# Patient Record
Sex: Female | Born: 1965 | Race: White | Hispanic: No | Marital: Married | Smoking: Never smoker
Health system: Southern US, Community
[De-identification: ages and names within clinical notes are randomized; demographics above are authoritative.]

---

## 2009-08-04 ENCOUNTER — Encounter: Admission: RE | Admit: 2009-08-04 | Discharge: 2009-08-04 | Payer: Self-pay | Admitting: Family Medicine

## 2010-01-19 ENCOUNTER — Encounter: Admission: RE | Admit: 2010-01-19 | Discharge: 2010-01-19 | Payer: Self-pay | Admitting: Family Medicine

## 2010-08-05 ENCOUNTER — Encounter
Admission: RE | Admit: 2010-08-05 | Discharge: 2010-08-05 | Payer: Self-pay | Source: Home / Self Care | Attending: Family Medicine | Admitting: Family Medicine

## 2011-07-11 ENCOUNTER — Other Ambulatory Visit: Payer: Self-pay | Admitting: Obstetrics and Gynecology

## 2011-07-11 DIAGNOSIS — Z1231 Encounter for screening mammogram for malignant neoplasm of breast: Secondary | ICD-10-CM

## 2011-08-09 ENCOUNTER — Ambulatory Visit
Admission: RE | Admit: 2011-08-09 | Discharge: 2011-08-09 | Disposition: A | Discharge status: Home / Self Care | Payer: Self-pay | Source: Ambulatory Visit | Attending: Obstetrics and Gynecology | Admitting: Obstetrics and Gynecology

## 2011-08-09 DIAGNOSIS — Z1231 Encounter for screening mammogram for malignant neoplasm of breast: Secondary | ICD-10-CM

## 2012-07-09 ENCOUNTER — Other Ambulatory Visit: Payer: Self-pay | Admitting: Obstetrics and Gynecology

## 2012-07-09 DIAGNOSIS — Z1231 Encounter for screening mammogram for malignant neoplasm of breast: Secondary | ICD-10-CM

## 2012-08-14 ENCOUNTER — Ambulatory Visit
Admission: RE | Admit: 2012-08-14 | Discharge: 2012-08-14 | Disposition: A | Discharge status: Home / Self Care | Payer: BC Managed Care – PPO | Source: Ambulatory Visit | Attending: Obstetrics and Gynecology | Admitting: Obstetrics and Gynecology

## 2012-08-14 DIAGNOSIS — Z1231 Encounter for screening mammogram for malignant neoplasm of breast: Secondary | ICD-10-CM

## 2013-08-05 ENCOUNTER — Other Ambulatory Visit: Payer: Self-pay

## 2013-08-05 DIAGNOSIS — Z1231 Encounter for screening mammogram for malignant neoplasm of breast: Secondary | ICD-10-CM

## 2013-08-27 ENCOUNTER — Ambulatory Visit
Admission: RE | Admit: 2013-08-27 | Discharge: 2013-08-27 | Disposition: A | Discharge status: Home / Self Care | Payer: BC Managed Care – PPO | Source: Ambulatory Visit

## 2013-08-27 DIAGNOSIS — Z1231 Encounter for screening mammogram for malignant neoplasm of breast: Secondary | ICD-10-CM

## 2014-08-11 ENCOUNTER — Other Ambulatory Visit: Payer: Self-pay

## 2014-08-11 DIAGNOSIS — Z1231 Encounter for screening mammogram for malignant neoplasm of breast: Secondary | ICD-10-CM

## 2014-08-28 ENCOUNTER — Ambulatory Visit
Admission: RE | Admit: 2014-08-28 | Discharge: 2014-08-28 | Disposition: A | Discharge status: Home / Self Care | Payer: BLUE CROSS/BLUE SHIELD | Source: Ambulatory Visit

## 2014-08-28 DIAGNOSIS — Z1231 Encounter for screening mammogram for malignant neoplasm of breast: Secondary | ICD-10-CM

## 2015-08-05 ENCOUNTER — Other Ambulatory Visit: Payer: Self-pay

## 2015-08-12 ENCOUNTER — Other Ambulatory Visit: Payer: Self-pay | Admitting: Obstetrics and Gynecology

## 2015-08-12 DIAGNOSIS — N644 Mastodynia: Secondary | ICD-10-CM

## 2015-08-19 ENCOUNTER — Ambulatory Visit
Admission: RE | Admit: 2015-08-19 | Discharge: 2015-08-19 | Disposition: A | Discharge status: Home / Self Care | Payer: BLUE CROSS/BLUE SHIELD | Source: Ambulatory Visit | Attending: Obstetrics and Gynecology | Admitting: Obstetrics and Gynecology

## 2015-08-19 ENCOUNTER — Other Ambulatory Visit: Payer: Self-pay | Admitting: Obstetrics and Gynecology

## 2015-08-19 DIAGNOSIS — N6320 Unspecified lump in the left breast, unspecified quadrant: Secondary | ICD-10-CM

## 2015-08-19 DIAGNOSIS — N644 Mastodynia: Secondary | ICD-10-CM

## 2015-12-02 DIAGNOSIS — L84 Corns and callosities: Secondary | ICD-10-CM | POA: Diagnosis not present

## 2015-12-02 DIAGNOSIS — Z86018 Personal history of other benign neoplasm: Secondary | ICD-10-CM | POA: Diagnosis not present

## 2015-12-02 DIAGNOSIS — Z808 Family history of malignant neoplasm of other organs or systems: Secondary | ICD-10-CM | POA: Diagnosis not present

## 2015-12-02 DIAGNOSIS — D225 Melanocytic nevi of trunk: Secondary | ICD-10-CM | POA: Diagnosis not present

## 2016-05-11 DIAGNOSIS — Z23 Encounter for immunization: Secondary | ICD-10-CM | POA: Diagnosis not present

## 2016-05-11 DIAGNOSIS — H01009 Unspecified blepharitis unspecified eye, unspecified eyelid: Secondary | ICD-10-CM | POA: Diagnosis not present

## 2016-05-11 DIAGNOSIS — E039 Hypothyroidism, unspecified: Secondary | ICD-10-CM | POA: Diagnosis not present

## 2016-05-11 DIAGNOSIS — H00016 Hordeolum externum left eye, unspecified eyelid: Secondary | ICD-10-CM | POA: Diagnosis not present

## 2016-06-07 DIAGNOSIS — Z6826 Body mass index (BMI) 26.0-26.9, adult: Secondary | ICD-10-CM | POA: Diagnosis not present

## 2016-06-07 DIAGNOSIS — Z01419 Encounter for gynecological examination (general) (routine) without abnormal findings: Secondary | ICD-10-CM | POA: Diagnosis not present

## 2016-07-05 DIAGNOSIS — Z808 Family history of malignant neoplasm of other organs or systems: Secondary | ICD-10-CM | POA: Diagnosis not present

## 2016-07-05 DIAGNOSIS — D225 Melanocytic nevi of trunk: Secondary | ICD-10-CM | POA: Diagnosis not present

## 2016-07-05 DIAGNOSIS — Z23 Encounter for immunization: Secondary | ICD-10-CM | POA: Diagnosis not present

## 2016-08-29 ENCOUNTER — Other Ambulatory Visit: Payer: Self-pay | Admitting: Obstetrics and Gynecology

## 2016-08-29 DIAGNOSIS — Z1231 Encounter for screening mammogram for malignant neoplasm of breast: Secondary | ICD-10-CM

## 2016-09-07 DIAGNOSIS — N924 Excessive bleeding in the premenopausal period: Secondary | ICD-10-CM | POA: Diagnosis not present

## 2016-09-07 DIAGNOSIS — N921 Excessive and frequent menstruation with irregular cycle: Secondary | ICD-10-CM | POA: Diagnosis not present

## 2016-09-22 DIAGNOSIS — Z7189 Other specified counseling: Secondary | ICD-10-CM | POA: Diagnosis not present

## 2016-09-22 DIAGNOSIS — F329 Major depressive disorder, single episode, unspecified: Secondary | ICD-10-CM | POA: Diagnosis not present

## 2016-09-22 DIAGNOSIS — F419 Anxiety disorder, unspecified: Secondary | ICD-10-CM | POA: Diagnosis not present

## 2016-09-22 DIAGNOSIS — E039 Hypothyroidism, unspecified: Secondary | ICD-10-CM | POA: Diagnosis not present

## 2016-09-26 ENCOUNTER — Ambulatory Visit
Admission: RE | Admit: 2016-09-26 | Discharge: 2016-09-26 | Disposition: A | Discharge status: Home / Self Care | Payer: BLUE CROSS/BLUE SHIELD | Source: Ambulatory Visit | Attending: Obstetrics and Gynecology | Admitting: Obstetrics and Gynecology

## 2016-09-26 DIAGNOSIS — Z1231 Encounter for screening mammogram for malignant neoplasm of breast: Secondary | ICD-10-CM

## 2016-11-22 DIAGNOSIS — N926 Irregular menstruation, unspecified: Secondary | ICD-10-CM | POA: Diagnosis not present

## 2016-11-22 DIAGNOSIS — E039 Hypothyroidism, unspecified: Secondary | ICD-10-CM | POA: Diagnosis not present

## 2016-11-22 DIAGNOSIS — R1032 Left lower quadrant pain: Secondary | ICD-10-CM | POA: Diagnosis not present

## 2017-02-06 DIAGNOSIS — D225 Melanocytic nevi of trunk: Secondary | ICD-10-CM | POA: Diagnosis not present

## 2017-02-06 DIAGNOSIS — L249 Irritant contact dermatitis, unspecified cause: Secondary | ICD-10-CM | POA: Diagnosis not present

## 2017-02-06 DIAGNOSIS — Z808 Family history of malignant neoplasm of other organs or systems: Secondary | ICD-10-CM | POA: Diagnosis not present

## 2017-02-06 DIAGNOSIS — Z86018 Personal history of other benign neoplasm: Secondary | ICD-10-CM | POA: Diagnosis not present

## 2017-06-12 DIAGNOSIS — Z23 Encounter for immunization: Secondary | ICD-10-CM | POA: Diagnosis not present

## 2017-06-19 DIAGNOSIS — Z6825 Body mass index (BMI) 25.0-25.9, adult: Secondary | ICD-10-CM | POA: Diagnosis not present

## 2017-06-19 DIAGNOSIS — Z3200 Encounter for pregnancy test, result unknown: Secondary | ICD-10-CM | POA: Diagnosis not present

## 2017-06-19 DIAGNOSIS — Z01419 Encounter for gynecological examination (general) (routine) without abnormal findings: Secondary | ICD-10-CM | POA: Diagnosis not present

## 2017-06-19 DIAGNOSIS — N952 Postmenopausal atrophic vaginitis: Secondary | ICD-10-CM | POA: Diagnosis not present

## 2017-08-09 DIAGNOSIS — Z86018 Personal history of other benign neoplasm: Secondary | ICD-10-CM | POA: Diagnosis not present

## 2017-08-09 DIAGNOSIS — Z808 Family history of malignant neoplasm of other organs or systems: Secondary | ICD-10-CM | POA: Diagnosis not present

## 2017-08-09 DIAGNOSIS — D225 Melanocytic nevi of trunk: Secondary | ICD-10-CM | POA: Diagnosis not present

## 2017-08-09 DIAGNOSIS — L570 Actinic keratosis: Secondary | ICD-10-CM | POA: Diagnosis not present

## 2017-08-17 ENCOUNTER — Other Ambulatory Visit: Payer: Self-pay | Admitting: Obstetrics and Gynecology

## 2017-08-17 DIAGNOSIS — Z1231 Encounter for screening mammogram for malignant neoplasm of breast: Secondary | ICD-10-CM

## 2017-08-23 DIAGNOSIS — S86111D Strain of other muscle(s) and tendon(s) of posterior muscle group at lower leg level, right leg, subsequent encounter: Secondary | ICD-10-CM | POA: Diagnosis not present

## 2017-08-23 DIAGNOSIS — S86811A Strain of other muscle(s) and tendon(s) at lower leg level, right leg, initial encounter: Secondary | ICD-10-CM | POA: Diagnosis not present

## 2017-08-23 DIAGNOSIS — M79661 Pain in right lower leg: Secondary | ICD-10-CM | POA: Diagnosis not present

## 2017-08-27 DIAGNOSIS — F419 Anxiety disorder, unspecified: Secondary | ICD-10-CM | POA: Diagnosis not present

## 2017-08-27 DIAGNOSIS — F322 Major depressive disorder, single episode, severe without psychotic features: Secondary | ICD-10-CM | POA: Diagnosis not present

## 2017-08-27 DIAGNOSIS — Z131 Encounter for screening for diabetes mellitus: Secondary | ICD-10-CM | POA: Diagnosis not present

## 2017-08-27 DIAGNOSIS — E039 Hypothyroidism, unspecified: Secondary | ICD-10-CM | POA: Diagnosis not present

## 2017-08-27 DIAGNOSIS — E559 Vitamin D deficiency, unspecified: Secondary | ICD-10-CM | POA: Diagnosis not present

## 2017-08-29 DIAGNOSIS — M79661 Pain in right lower leg: Secondary | ICD-10-CM | POA: Diagnosis not present

## 2017-08-29 DIAGNOSIS — S86111D Strain of other muscle(s) and tendon(s) of posterior muscle group at lower leg level, right leg, subsequent encounter: Secondary | ICD-10-CM | POA: Diagnosis not present

## 2017-08-31 DIAGNOSIS — S86111D Strain of other muscle(s) and tendon(s) of posterior muscle group at lower leg level, right leg, subsequent encounter: Secondary | ICD-10-CM | POA: Diagnosis not present

## 2017-08-31 DIAGNOSIS — M79661 Pain in right lower leg: Secondary | ICD-10-CM | POA: Diagnosis not present

## 2017-09-03 DIAGNOSIS — S86111D Strain of other muscle(s) and tendon(s) of posterior muscle group at lower leg level, right leg, subsequent encounter: Secondary | ICD-10-CM | POA: Diagnosis not present

## 2017-09-03 DIAGNOSIS — M79661 Pain in right lower leg: Secondary | ICD-10-CM | POA: Diagnosis not present

## 2017-09-04 DIAGNOSIS — M79661 Pain in right lower leg: Secondary | ICD-10-CM | POA: Diagnosis not present

## 2017-09-04 DIAGNOSIS — M2012 Hallux valgus (acquired), left foot: Secondary | ICD-10-CM | POA: Diagnosis not present

## 2017-09-04 DIAGNOSIS — S86111D Strain of other muscle(s) and tendon(s) of posterior muscle group at lower leg level, right leg, subsequent encounter: Secondary | ICD-10-CM | POA: Diagnosis not present

## 2017-09-04 DIAGNOSIS — M2011 Hallux valgus (acquired), right foot: Secondary | ICD-10-CM | POA: Diagnosis not present

## 2017-09-11 DIAGNOSIS — S86111D Strain of other muscle(s) and tendon(s) of posterior muscle group at lower leg level, right leg, subsequent encounter: Secondary | ICD-10-CM | POA: Diagnosis not present

## 2017-09-11 DIAGNOSIS — M79661 Pain in right lower leg: Secondary | ICD-10-CM | POA: Diagnosis not present

## 2017-09-11 DIAGNOSIS — D485 Neoplasm of uncertain behavior of skin: Secondary | ICD-10-CM | POA: Diagnosis not present

## 2017-09-11 DIAGNOSIS — L570 Actinic keratosis: Secondary | ICD-10-CM | POA: Diagnosis not present

## 2017-09-13 DIAGNOSIS — M79661 Pain in right lower leg: Secondary | ICD-10-CM | POA: Diagnosis not present

## 2017-09-13 DIAGNOSIS — S86111D Strain of other muscle(s) and tendon(s) of posterior muscle group at lower leg level, right leg, subsequent encounter: Secondary | ICD-10-CM | POA: Diagnosis not present

## 2017-09-18 DIAGNOSIS — M79661 Pain in right lower leg: Secondary | ICD-10-CM | POA: Diagnosis not present

## 2017-09-18 DIAGNOSIS — S86111D Strain of other muscle(s) and tendon(s) of posterior muscle group at lower leg level, right leg, subsequent encounter: Secondary | ICD-10-CM | POA: Diagnosis not present

## 2017-09-19 DIAGNOSIS — Z3202 Encounter for pregnancy test, result negative: Secondary | ICD-10-CM | POA: Diagnosis not present

## 2017-09-19 DIAGNOSIS — N952 Postmenopausal atrophic vaginitis: Secondary | ICD-10-CM | POA: Diagnosis not present

## 2017-09-19 DIAGNOSIS — N941 Unspecified dyspareunia: Secondary | ICD-10-CM | POA: Diagnosis not present

## 2017-09-20 DIAGNOSIS — M79661 Pain in right lower leg: Secondary | ICD-10-CM | POA: Diagnosis not present

## 2017-09-20 DIAGNOSIS — S86111D Strain of other muscle(s) and tendon(s) of posterior muscle group at lower leg level, right leg, subsequent encounter: Secondary | ICD-10-CM | POA: Diagnosis not present

## 2017-09-28 ENCOUNTER — Ambulatory Visit
Admission: RE | Admit: 2017-09-28 | Discharge: 2017-09-28 | Disposition: A | Discharge status: Home / Self Care | Payer: BLUE CROSS/BLUE SHIELD | Source: Ambulatory Visit | Attending: Obstetrics and Gynecology | Admitting: Obstetrics and Gynecology

## 2017-09-28 DIAGNOSIS — Z1231 Encounter for screening mammogram for malignant neoplasm of breast: Secondary | ICD-10-CM | POA: Diagnosis not present

## 2017-09-28 DIAGNOSIS — M79661 Pain in right lower leg: Secondary | ICD-10-CM | POA: Diagnosis not present

## 2017-09-28 DIAGNOSIS — S86111D Strain of other muscle(s) and tendon(s) of posterior muscle group at lower leg level, right leg, subsequent encounter: Secondary | ICD-10-CM | POA: Diagnosis not present

## 2017-10-04 DIAGNOSIS — S86111D Strain of other muscle(s) and tendon(s) of posterior muscle group at lower leg level, right leg, subsequent encounter: Secondary | ICD-10-CM | POA: Diagnosis not present

## 2017-10-04 DIAGNOSIS — M79661 Pain in right lower leg: Secondary | ICD-10-CM | POA: Diagnosis not present

## 2017-10-10 DIAGNOSIS — S86111D Strain of other muscle(s) and tendon(s) of posterior muscle group at lower leg level, right leg, subsequent encounter: Secondary | ICD-10-CM | POA: Diagnosis not present

## 2017-10-10 DIAGNOSIS — M79661 Pain in right lower leg: Secondary | ICD-10-CM | POA: Diagnosis not present

## 2017-10-12 DIAGNOSIS — S86111D Strain of other muscle(s) and tendon(s) of posterior muscle group at lower leg level, right leg, subsequent encounter: Secondary | ICD-10-CM | POA: Diagnosis not present

## 2017-10-12 DIAGNOSIS — M79661 Pain in right lower leg: Secondary | ICD-10-CM | POA: Diagnosis not present

## 2017-10-15 DIAGNOSIS — M79661 Pain in right lower leg: Secondary | ICD-10-CM | POA: Diagnosis not present

## 2017-11-15 DIAGNOSIS — M79661 Pain in right lower leg: Secondary | ICD-10-CM | POA: Diagnosis not present

## 2017-11-21 DIAGNOSIS — M79661 Pain in right lower leg: Secondary | ICD-10-CM | POA: Diagnosis not present

## 2017-11-26 DIAGNOSIS — M79661 Pain in right lower leg: Secondary | ICD-10-CM | POA: Diagnosis not present

## 2017-12-25 DIAGNOSIS — M79661 Pain in right lower leg: Secondary | ICD-10-CM | POA: Diagnosis not present

## 2018-02-06 DIAGNOSIS — D225 Melanocytic nevi of trunk: Secondary | ICD-10-CM | POA: Diagnosis not present

## 2018-02-06 DIAGNOSIS — Z86018 Personal history of other benign neoplasm: Secondary | ICD-10-CM | POA: Diagnosis not present

## 2018-02-06 DIAGNOSIS — Z808 Family history of malignant neoplasm of other organs or systems: Secondary | ICD-10-CM | POA: Diagnosis not present

## 2018-04-25 DIAGNOSIS — J019 Acute sinusitis, unspecified: Secondary | ICD-10-CM | POA: Diagnosis not present

## 2018-05-31 DIAGNOSIS — F331 Major depressive disorder, recurrent, moderate: Secondary | ICD-10-CM | POA: Diagnosis not present

## 2018-06-24 DIAGNOSIS — Z01419 Encounter for gynecological examination (general) (routine) without abnormal findings: Secondary | ICD-10-CM | POA: Diagnosis not present

## 2018-06-24 DIAGNOSIS — Z6825 Body mass index (BMI) 25.0-25.9, adult: Secondary | ICD-10-CM | POA: Diagnosis not present

## 2018-08-21 DIAGNOSIS — Z23 Encounter for immunization: Secondary | ICD-10-CM | POA: Diagnosis not present

## 2018-08-21 DIAGNOSIS — D2262 Melanocytic nevi of left upper limb, including shoulder: Secondary | ICD-10-CM | POA: Diagnosis not present

## 2018-08-21 DIAGNOSIS — D225 Melanocytic nevi of trunk: Secondary | ICD-10-CM | POA: Diagnosis not present

## 2018-08-21 DIAGNOSIS — L821 Other seborrheic keratosis: Secondary | ICD-10-CM | POA: Diagnosis not present

## 2018-08-21 DIAGNOSIS — L570 Actinic keratosis: Secondary | ICD-10-CM | POA: Diagnosis not present

## 2018-08-24 DIAGNOSIS — M67912 Unspecified disorder of synovium and tendon, left shoulder: Secondary | ICD-10-CM | POA: Diagnosis not present

## 2018-08-28 DIAGNOSIS — E559 Vitamin D deficiency, unspecified: Secondary | ICD-10-CM | POA: Diagnosis not present

## 2018-08-28 DIAGNOSIS — F322 Major depressive disorder, single episode, severe without psychotic features: Secondary | ICD-10-CM | POA: Diagnosis not present

## 2018-08-28 DIAGNOSIS — E039 Hypothyroidism, unspecified: Secondary | ICD-10-CM | POA: Diagnosis not present

## 2018-09-24 ENCOUNTER — Other Ambulatory Visit: Payer: Self-pay | Admitting: Obstetrics and Gynecology

## 2018-09-24 DIAGNOSIS — Z1231 Encounter for screening mammogram for malignant neoplasm of breast: Secondary | ICD-10-CM

## 2018-10-10 ENCOUNTER — Ambulatory Visit
Admission: RE | Admit: 2018-10-10 | Discharge: 2018-10-10 | Disposition: A | Discharge status: Home / Self Care | Payer: BLUE CROSS/BLUE SHIELD | Source: Ambulatory Visit | Attending: Obstetrics and Gynecology | Admitting: Obstetrics and Gynecology

## 2018-10-10 ENCOUNTER — Other Ambulatory Visit: Payer: Self-pay

## 2018-10-10 DIAGNOSIS — Z1231 Encounter for screening mammogram for malignant neoplasm of breast: Secondary | ICD-10-CM | POA: Diagnosis not present

## 2018-10-14 DIAGNOSIS — M67912 Unspecified disorder of synovium and tendon, left shoulder: Secondary | ICD-10-CM | POA: Diagnosis not present

## 2018-10-14 DIAGNOSIS — M79672 Pain in left foot: Secondary | ICD-10-CM | POA: Diagnosis not present

## 2018-10-18 ENCOUNTER — Ambulatory Visit: Payer: BLUE CROSS/BLUE SHIELD

## 2019-01-24 DIAGNOSIS — F419 Anxiety disorder, unspecified: Secondary | ICD-10-CM | POA: Diagnosis not present

## 2019-01-24 DIAGNOSIS — F329 Major depressive disorder, single episode, unspecified: Secondary | ICD-10-CM | POA: Diagnosis not present

## 2019-01-27 ENCOUNTER — Other Ambulatory Visit: Payer: Self-pay

## 2019-01-27 ENCOUNTER — Other Ambulatory Visit: Payer: Self-pay | Admitting: Podiatry

## 2019-01-27 ENCOUNTER — Encounter: Payer: Self-pay | Admitting: Podiatry

## 2019-01-27 ENCOUNTER — Ambulatory Visit (INDEPENDENT_AMBULATORY_CARE_PROVIDER_SITE_OTHER): Payer: BLUE CROSS/BLUE SHIELD | Admitting: Podiatry

## 2019-01-27 ENCOUNTER — Ambulatory Visit (INDEPENDENT_AMBULATORY_CARE_PROVIDER_SITE_OTHER): Payer: BLUE CROSS/BLUE SHIELD

## 2019-01-27 VITALS — Temp 97.9°F

## 2019-01-27 DIAGNOSIS — M779 Enthesopathy, unspecified: Secondary | ICD-10-CM

## 2019-01-27 DIAGNOSIS — M79672 Pain in left foot: Secondary | ICD-10-CM

## 2019-01-27 DIAGNOSIS — M722 Plantar fascial fibromatosis: Secondary | ICD-10-CM

## 2019-01-27 DIAGNOSIS — M79671 Pain in right foot: Secondary | ICD-10-CM | POA: Diagnosis not present

## 2019-01-27 NOTE — Progress Notes (Signed)
Subjective:   Patient ID: Sherri Bell, female   DOB: 54 y.o.   MRN: [id number]   HPI Patient presents with moderate chronic plantar midfoot pain bilateral stating that she has had orthotics for approximately 18 years which have helped her but they are starting to lose their ability and it is worse when she is barefoot or when she wears shoes without support.  States it is gradually become more of an issue for her and patient does not smoke and likes to be active   Review of Systems  All other systems reviewed and are negative.       Objective:  Physical Exam Vitals signs and nursing note reviewed.  Constitutional:      Appearance: She is well-developed.  Pulmonary:     Effort: Pulmonary effort is normal.  Musculoskeletal: Normal range of motion.  Skin:    General: Skin is warm.  Neurological:     Mental Status: She is alert.     Neurovascular status found to be intact muscle strength is adequate range of motion within normal limits with patient found to have midfoot pain bilateral that also is secondary to her high arch foot structure with a thin heel pad and also bunion deformity right over left.  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  At this point we discussed things for her to do it at this point I recommended orthotics and she saw the ped orthotist who casted for functional orthotics and will have these made for her at the current time.  Patient will be seen back when ready and we discussed continued exercises and possibility for physical therapy and at this point I am referring to physical therapist for strengthening and stretching exercises due to the longstanding nature of chronic pain  X-rays indicate there is significant structural bunion  deformity right over left with cavus foot structure bilateral

## 2019-01-28 NOTE — Addendum Note (Signed)
Addended by: Celene Skeen A on: 01/28/2019 12:34 PM   Modules accepted: Orders

## 2019-02-07 DIAGNOSIS — M79661 Pain in right lower leg: Secondary | ICD-10-CM | POA: Diagnosis not present

## 2019-02-07 DIAGNOSIS — M79672 Pain in left foot: Secondary | ICD-10-CM | POA: Diagnosis not present

## 2019-02-07 DIAGNOSIS — M79671 Pain in right foot: Secondary | ICD-10-CM | POA: Diagnosis not present

## 2019-02-13 DIAGNOSIS — Z808 Family history of malignant neoplasm of other organs or systems: Secondary | ICD-10-CM | POA: Diagnosis not present

## 2019-02-13 DIAGNOSIS — D2272 Melanocytic nevi of left lower limb, including hip: Secondary | ICD-10-CM | POA: Diagnosis not present

## 2019-02-13 DIAGNOSIS — Z86018 Personal history of other benign neoplasm: Secondary | ICD-10-CM | POA: Diagnosis not present

## 2019-02-13 DIAGNOSIS — D225 Melanocytic nevi of trunk: Secondary | ICD-10-CM | POA: Diagnosis not present

## 2019-02-14 DIAGNOSIS — M79671 Pain in right foot: Secondary | ICD-10-CM | POA: Diagnosis not present

## 2019-02-14 DIAGNOSIS — M79672 Pain in left foot: Secondary | ICD-10-CM | POA: Diagnosis not present

## 2019-02-14 DIAGNOSIS — M79661 Pain in right lower leg: Secondary | ICD-10-CM | POA: Diagnosis not present

## 2019-02-18 ENCOUNTER — Other Ambulatory Visit: Payer: Self-pay

## 2019-02-18 ENCOUNTER — Ambulatory Visit: Payer: BLUE CROSS/BLUE SHIELD | Admitting: Orthotics

## 2019-02-18 DIAGNOSIS — M722 Plantar fascial fibromatosis: Secondary | ICD-10-CM

## 2019-02-18 DIAGNOSIS — M79671 Pain in right foot: Secondary | ICD-10-CM

## 2019-02-18 DIAGNOSIS — M79672 Pain in left foot: Secondary | ICD-10-CM

## 2019-02-18 NOTE — Progress Notes (Signed)
Patient came in today to pick up custom made foot orthotics.  The goals were accomplished and the patient reported no dissatisfaction with said orthotics.  Patient was advised of breakin period and how to report any issues. 

## 2019-02-21 DIAGNOSIS — M79661 Pain in right lower leg: Secondary | ICD-10-CM | POA: Diagnosis not present

## 2019-02-21 DIAGNOSIS — M79671 Pain in right foot: Secondary | ICD-10-CM | POA: Diagnosis not present

## 2019-02-21 DIAGNOSIS — M79672 Pain in left foot: Secondary | ICD-10-CM | POA: Diagnosis not present

## 2019-03-27 DIAGNOSIS — F329 Major depressive disorder, single episode, unspecified: Secondary | ICD-10-CM | POA: Diagnosis not present

## 2019-03-27 DIAGNOSIS — M549 Dorsalgia, unspecified: Secondary | ICD-10-CM | POA: Diagnosis not present

## 2019-03-27 DIAGNOSIS — F419 Anxiety disorder, unspecified: Secondary | ICD-10-CM | POA: Diagnosis not present

## 2019-03-27 DIAGNOSIS — Z23 Encounter for immunization: Secondary | ICD-10-CM | POA: Diagnosis not present

## 2019-03-31 ENCOUNTER — Other Ambulatory Visit: Payer: Self-pay

## 2019-03-31 ENCOUNTER — Ambulatory Visit: Payer: BLUE CROSS/BLUE SHIELD | Admitting: Orthotics

## 2019-03-31 DIAGNOSIS — M79672 Pain in left foot: Secondary | ICD-10-CM

## 2019-03-31 DIAGNOSIS — M79671 Pain in right foot: Secondary | ICD-10-CM

## 2019-03-31 DIAGNOSIS — M722 Plantar fascial fibromatosis: Secondary | ICD-10-CM

## 2019-04-03 DIAGNOSIS — H00013 Hordeolum externum right eye, unspecified eyelid: Secondary | ICD-10-CM | POA: Diagnosis not present

## 2019-04-08 NOTE — Progress Notes (Signed)
Adjusted f/o to patients satisfaction.

## 2019-04-17 DIAGNOSIS — R6882 Decreased libido: Secondary | ICD-10-CM | POA: Diagnosis not present

## 2019-04-17 DIAGNOSIS — R102 Pelvic and perineal pain: Secondary | ICD-10-CM | POA: Diagnosis not present

## 2019-04-17 DIAGNOSIS — N912 Amenorrhea, unspecified: Secondary | ICD-10-CM | POA: Diagnosis not present

## 2019-05-14 DIAGNOSIS — Z23 Encounter for immunization: Secondary | ICD-10-CM | POA: Diagnosis not present

## 2019-06-02 DIAGNOSIS — M549 Dorsalgia, unspecified: Secondary | ICD-10-CM | POA: Diagnosis not present

## 2019-06-30 DIAGNOSIS — N950 Postmenopausal bleeding: Secondary | ICD-10-CM | POA: Diagnosis not present

## 2019-06-30 DIAGNOSIS — Z6826 Body mass index (BMI) 26.0-26.9, adult: Secondary | ICD-10-CM | POA: Diagnosis not present

## 2019-06-30 DIAGNOSIS — Z01419 Encounter for gynecological examination (general) (routine) without abnormal findings: Secondary | ICD-10-CM | POA: Diagnosis not present

## 2019-06-30 DIAGNOSIS — N952 Postmenopausal atrophic vaginitis: Secondary | ICD-10-CM | POA: Diagnosis not present

## 2019-07-03 ENCOUNTER — Other Ambulatory Visit: Payer: Self-pay | Admitting: Physician Assistant

## 2019-07-03 DIAGNOSIS — M549 Dorsalgia, unspecified: Secondary | ICD-10-CM

## 2019-07-16 ENCOUNTER — Ambulatory Visit
Admission: RE | Admit: 2019-07-16 | Discharge: 2019-07-16 | Disposition: A | Discharge status: Home / Self Care | Payer: BLUE CROSS/BLUE SHIELD | Source: Ambulatory Visit | Attending: Physician Assistant | Admitting: Physician Assistant

## 2019-07-16 ENCOUNTER — Other Ambulatory Visit: Payer: Self-pay

## 2019-07-16 DIAGNOSIS — M549 Dorsalgia, unspecified: Secondary | ICD-10-CM

## 2019-07-16 DIAGNOSIS — M5124 Other intervertebral disc displacement, thoracic region: Secondary | ICD-10-CM | POA: Diagnosis not present

## 2019-08-27 DIAGNOSIS — L821 Other seborrheic keratosis: Secondary | ICD-10-CM | POA: Diagnosis not present

## 2019-08-27 DIAGNOSIS — D2262 Melanocytic nevi of left upper limb, including shoulder: Secondary | ICD-10-CM | POA: Diagnosis not present

## 2019-08-27 DIAGNOSIS — L570 Actinic keratosis: Secondary | ICD-10-CM | POA: Diagnosis not present

## 2019-08-29 DIAGNOSIS — Z131 Encounter for screening for diabetes mellitus: Secondary | ICD-10-CM | POA: Diagnosis not present

## 2019-08-29 DIAGNOSIS — Z13220 Encounter for screening for lipoid disorders: Secondary | ICD-10-CM | POA: Diagnosis not present

## 2019-08-29 DIAGNOSIS — E039 Hypothyroidism, unspecified: Secondary | ICD-10-CM | POA: Diagnosis not present

## 2019-08-29 DIAGNOSIS — G43909 Migraine, unspecified, not intractable, without status migrainosus: Secondary | ICD-10-CM | POA: Diagnosis not present

## 2019-08-29 DIAGNOSIS — F322 Major depressive disorder, single episode, severe without psychotic features: Secondary | ICD-10-CM | POA: Diagnosis not present

## 2019-09-01 DIAGNOSIS — Z23 Encounter for immunization: Secondary | ICD-10-CM | POA: Diagnosis not present

## 2019-09-19 ENCOUNTER — Other Ambulatory Visit: Payer: Self-pay | Admitting: Obstetrics and Gynecology

## 2019-09-19 DIAGNOSIS — Z1231 Encounter for screening mammogram for malignant neoplasm of breast: Secondary | ICD-10-CM

## 2019-10-23 ENCOUNTER — Ambulatory Visit: Payer: BLUE CROSS/BLUE SHIELD

## 2019-11-27 DIAGNOSIS — K529 Noninfective gastroenteritis and colitis, unspecified: Secondary | ICD-10-CM | POA: Diagnosis not present

## 2019-12-25 ENCOUNTER — Other Ambulatory Visit: Payer: Self-pay

## 2019-12-25 ENCOUNTER — Ambulatory Visit
Admission: RE | Admit: 2019-12-25 | Discharge: 2019-12-25 | Disposition: A | Discharge status: Home / Self Care | Payer: BLUE CROSS/BLUE SHIELD | Source: Ambulatory Visit | Attending: Obstetrics and Gynecology | Admitting: Obstetrics and Gynecology

## 2019-12-25 DIAGNOSIS — Z1231 Encounter for screening mammogram for malignant neoplasm of breast: Secondary | ICD-10-CM | POA: Diagnosis not present

## 2020-01-15 DIAGNOSIS — N760 Acute vaginitis: Secondary | ICD-10-CM | POA: Diagnosis not present

## 2020-01-15 DIAGNOSIS — N950 Postmenopausal bleeding: Secondary | ICD-10-CM | POA: Diagnosis not present

## 2020-01-15 DIAGNOSIS — R102 Pelvic and perineal pain: Secondary | ICD-10-CM | POA: Diagnosis not present

## 2020-01-21 DIAGNOSIS — R7989 Other specified abnormal findings of blood chemistry: Secondary | ICD-10-CM | POA: Diagnosis not present

## 2020-01-21 DIAGNOSIS — R102 Pelvic and perineal pain: Secondary | ICD-10-CM | POA: Diagnosis not present

## 2020-01-21 DIAGNOSIS — Z309 Encounter for contraceptive management, unspecified: Secondary | ICD-10-CM | POA: Diagnosis not present

## 2020-08-05 ENCOUNTER — Ambulatory Visit: Payer: Managed Care, Other (non HMO) | Admitting: Podiatry

## 2020-08-05 ENCOUNTER — Ambulatory Visit (INDEPENDENT_AMBULATORY_CARE_PROVIDER_SITE_OTHER): Payer: Managed Care, Other (non HMO)

## 2020-08-05 ENCOUNTER — Other Ambulatory Visit: Payer: Self-pay

## 2020-08-05 ENCOUNTER — Encounter: Payer: Self-pay | Admitting: Podiatry

## 2020-08-05 DIAGNOSIS — M79671 Pain in right foot: Secondary | ICD-10-CM | POA: Diagnosis not present

## 2020-08-05 DIAGNOSIS — M25571 Pain in right ankle and joints of right foot: Secondary | ICD-10-CM

## 2020-08-05 DIAGNOSIS — M79672 Pain in left foot: Secondary | ICD-10-CM

## 2020-08-05 DIAGNOSIS — M7671 Peroneal tendinitis, right leg: Secondary | ICD-10-CM | POA: Diagnosis not present

## 2020-08-05 DIAGNOSIS — M722 Plantar fascial fibromatosis: Secondary | ICD-10-CM | POA: Diagnosis not present

## 2020-08-05 DIAGNOSIS — Q828 Other specified congenital malformations of skin: Secondary | ICD-10-CM

## 2020-08-05 MED ORDER — TRIAMCINOLONE ACETONIDE 10 MG/ML IJ SUSP
10.0000 mg | Freq: Once | INTRAMUSCULAR | Status: AC
  Administered 2020-08-05: 10 mg

## 2020-08-06 NOTE — Progress Notes (Signed)
Subjective:   Patient ID: Sherri Bell, female   DOB: 55 y.o.   MRN: [id number]   HPI Patient presents stating she has a lot of pain in the outside of her right foot and also her heel while improved still is present some and she does not know of any specific injury   ROS      Objective:  Physical Exam  Neurovascular status intact with pain of the outside of the right foot which is acute in nature with chronic discomfort of the plantar fascia bilateral with patient who does have history of plantar fasciitis has orthotics and needs modification of them     Assessment:  Neurovascular status intact with patient found to have inflammation pain of the outside of the right foot at the peroneal insertion base of fifth metatarsal with no indication of tendon disruption.  There is moderate discomfort in the plantar fascial bilateral     Plan:  Reviewed condition and at this point I went ahead and I did do H&P and then x-rays.  I did sterile prep and injected the peroneal complex 3 mg Dexasone Kenalog advised on ice therapy reduced activity and for the plantar fascia I have recommended continued orthotic usage and I sent to our ped orthotist for orthotic adjustments.  Patient will be seen back if symptoms were to indicate  X-rays were negative for signs of fracture associated with trauma with moderate plantar spur formation

## 2020-08-13 ENCOUNTER — Other Ambulatory Visit: Payer: Self-pay | Admitting: Podiatry

## 2020-08-13 DIAGNOSIS — M722 Plantar fascial fibromatosis: Secondary | ICD-10-CM

## 2020-08-18 ENCOUNTER — Other Ambulatory Visit: Payer: Self-pay | Admitting: Podiatry

## 2020-08-18 DIAGNOSIS — M722 Plantar fascial fibromatosis: Secondary | ICD-10-CM

## 2020-11-11 ENCOUNTER — Other Ambulatory Visit: Payer: Self-pay | Admitting: Obstetrics and Gynecology

## 2020-11-11 DIAGNOSIS — Z1231 Encounter for screening mammogram for malignant neoplasm of breast: Secondary | ICD-10-CM

## 2020-12-31 ENCOUNTER — Other Ambulatory Visit: Payer: Self-pay

## 2020-12-31 ENCOUNTER — Ambulatory Visit
Admission: RE | Admit: 2020-12-31 | Discharge: 2020-12-31 | Disposition: A | Discharge status: Home / Self Care | Payer: BLUE CROSS/BLUE SHIELD | Source: Ambulatory Visit | Attending: Obstetrics and Gynecology | Admitting: Obstetrics and Gynecology

## 2020-12-31 DIAGNOSIS — Z1231 Encounter for screening mammogram for malignant neoplasm of breast: Secondary | ICD-10-CM

## 2021-05-24 ENCOUNTER — Other Ambulatory Visit (HOSPITAL_BASED_OUTPATIENT_CLINIC_OR_DEPARTMENT_OTHER): Payer: Self-pay | Admitting: Family Medicine

## 2021-05-24 ENCOUNTER — Telehealth (HOSPITAL_BASED_OUTPATIENT_CLINIC_OR_DEPARTMENT_OTHER): Payer: Self-pay

## 2021-05-24 DIAGNOSIS — R1011 Right upper quadrant pain: Secondary | ICD-10-CM

## 2021-05-25 ENCOUNTER — Ambulatory Visit (HOSPITAL_BASED_OUTPATIENT_CLINIC_OR_DEPARTMENT_OTHER)
Admission: RE | Admit: 2021-05-25 | Discharge: 2021-05-25 | Disposition: A | Discharge status: Home / Self Care | Payer: Managed Care, Other (non HMO) | Source: Ambulatory Visit | Attending: Family Medicine | Admitting: Family Medicine

## 2021-05-25 ENCOUNTER — Other Ambulatory Visit (HOSPITAL_BASED_OUTPATIENT_CLINIC_OR_DEPARTMENT_OTHER): Payer: Self-pay | Admitting: Family Medicine

## 2021-05-25 ENCOUNTER — Other Ambulatory Visit: Payer: Self-pay

## 2021-05-25 DIAGNOSIS — R1011 Right upper quadrant pain: Secondary | ICD-10-CM | POA: Diagnosis present

## 2021-11-02 ENCOUNTER — Other Ambulatory Visit (HOSPITAL_BASED_OUTPATIENT_CLINIC_OR_DEPARTMENT_OTHER): Payer: Self-pay | Admitting: Family Medicine

## 2021-11-02 DIAGNOSIS — R160 Hepatomegaly, not elsewhere classified: Secondary | ICD-10-CM

## 2021-11-03 ENCOUNTER — Ambulatory Visit (HOSPITAL_BASED_OUTPATIENT_CLINIC_OR_DEPARTMENT_OTHER): Admission: RE | Admit: 2021-11-03 | Payer: Managed Care, Other (non HMO) | Source: Ambulatory Visit

## 2021-11-24 ENCOUNTER — Ambulatory Visit (HOSPITAL_BASED_OUTPATIENT_CLINIC_OR_DEPARTMENT_OTHER)
Admission: RE | Admit: 2021-11-24 | Discharge: 2021-11-24 | Disposition: A | Discharge status: Home / Self Care | Payer: Managed Care, Other (non HMO) | Source: Ambulatory Visit | Attending: Family Medicine | Admitting: Family Medicine

## 2021-11-24 DIAGNOSIS — R160 Hepatomegaly, not elsewhere classified: Secondary | ICD-10-CM | POA: Diagnosis present

## 2021-11-29 ENCOUNTER — Other Ambulatory Visit: Payer: Self-pay | Admitting: Obstetrics and Gynecology

## 2021-11-29 DIAGNOSIS — Z1231 Encounter for screening mammogram for malignant neoplasm of breast: Secondary | ICD-10-CM

## 2022-01-02 ENCOUNTER — Encounter: Payer: Self-pay | Admitting: Radiology

## 2022-01-02 ENCOUNTER — Ambulatory Visit
Admission: RE | Admit: 2022-01-02 | Discharge: 2022-01-02 | Disposition: A | Discharge status: Home / Self Care | Payer: Managed Care, Other (non HMO) | Source: Ambulatory Visit | Attending: Obstetrics and Gynecology | Admitting: Obstetrics and Gynecology

## 2022-01-02 DIAGNOSIS — Z1231 Encounter for screening mammogram for malignant neoplasm of breast: Secondary | ICD-10-CM

## 2022-10-31 ENCOUNTER — Other Ambulatory Visit (HOSPITAL_BASED_OUTPATIENT_CLINIC_OR_DEPARTMENT_OTHER): Payer: Self-pay | Admitting: Family Medicine

## 2022-10-31 DIAGNOSIS — D1803 Hemangioma of intra-abdominal structures: Secondary | ICD-10-CM

## 2022-11-02 ENCOUNTER — Ambulatory Visit (HOSPITAL_BASED_OUTPATIENT_CLINIC_OR_DEPARTMENT_OTHER)
Admission: RE | Admit: 2022-11-02 | Discharge: 2022-11-02 | Disposition: A | Discharge status: Home / Self Care | Payer: BLUE CROSS/BLUE SHIELD | Source: Ambulatory Visit | Attending: Family Medicine | Admitting: Family Medicine

## 2022-11-02 DIAGNOSIS — D1803 Hemangioma of intra-abdominal structures: Secondary | ICD-10-CM | POA: Insufficient documentation

## 2022-11-23 ENCOUNTER — Other Ambulatory Visit: Payer: Self-pay | Admitting: Obstetrics and Gynecology

## 2022-11-23 DIAGNOSIS — Z0000 Encounter for general adult medical examination without abnormal findings: Secondary | ICD-10-CM

## 2023-01-03 ENCOUNTER — Ambulatory Visit
Admission: RE | Admit: 2023-01-03 | Discharge: 2023-01-03 | Disposition: A | Discharge status: Home / Self Care | Payer: BC Managed Care – PPO | Source: Ambulatory Visit | Attending: Obstetrics and Gynecology | Admitting: Obstetrics and Gynecology

## 2023-01-03 DIAGNOSIS — Z0000 Encounter for general adult medical examination without abnormal findings: Secondary | ICD-10-CM

## 2023-03-07 ENCOUNTER — Other Ambulatory Visit (HOSPITAL_BASED_OUTPATIENT_CLINIC_OR_DEPARTMENT_OTHER): Payer: Self-pay | Admitting: Family Medicine

## 2023-03-07 DIAGNOSIS — M79644 Pain in right finger(s): Secondary | ICD-10-CM

## 2023-03-28 IMAGING — MG MM DIGITAL SCREENING BILAT W/ TOMO AND CAD
8 series · 9 of 24 positions shown · non-contrast
Comparison: Previous exam(s).

CLINICAL DATA: Screening.

EXAM:
DIGITAL SCREENING BILATERAL MAMMOGRAM WITH TOMOSYNTHESIS AND CAD
TECHNIQUE: Bilateral screening digital craniocaudal and mediolateral oblique
mammograms were obtained. Bilateral screening digital breast
tomosynthesis was performed. The images were evaluated with
computer-aided detection.

[R MLO synth-2D]
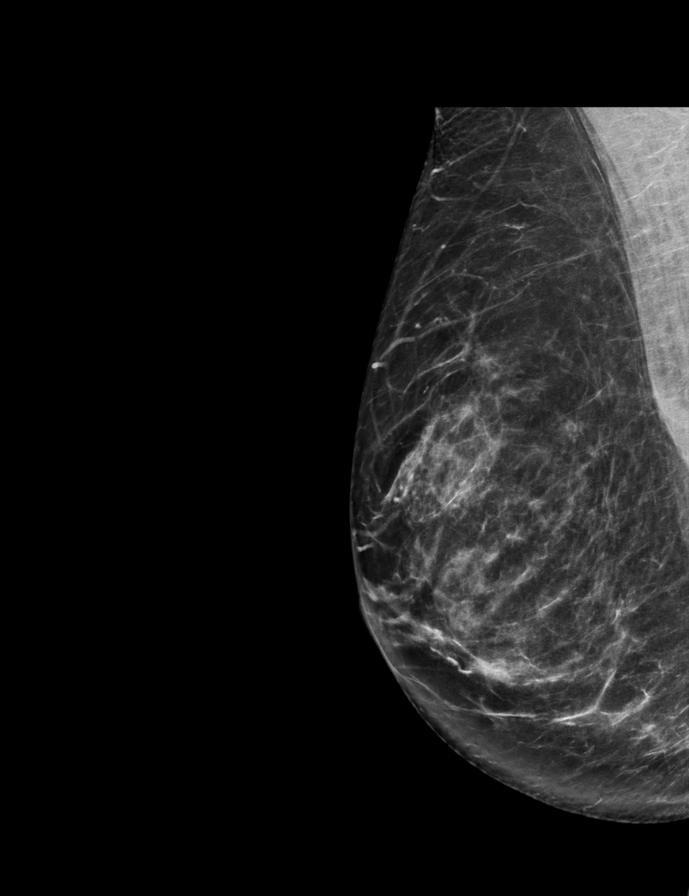

[L MLO synth-2D]
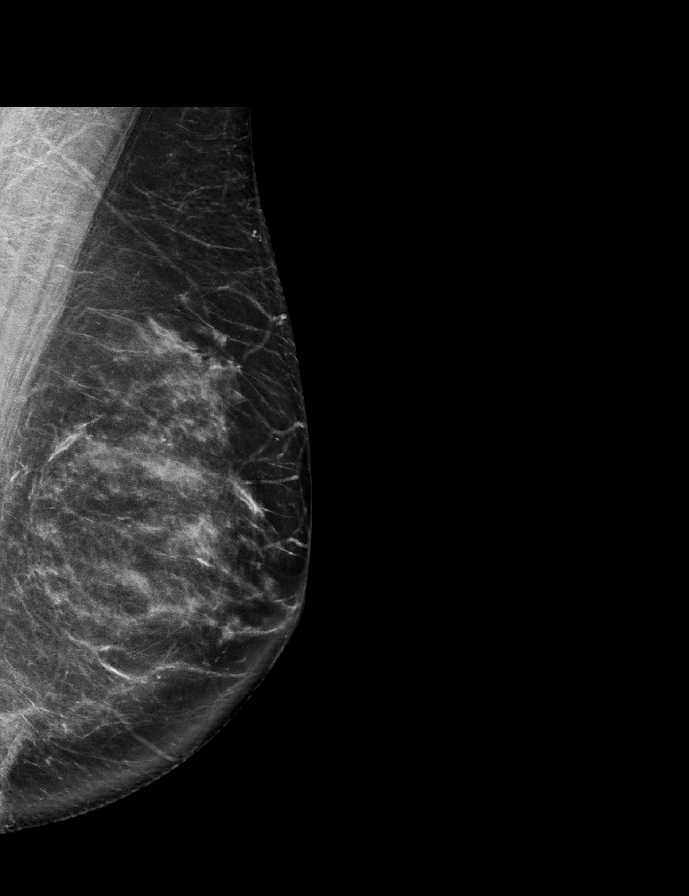

[R CC synth-2D]
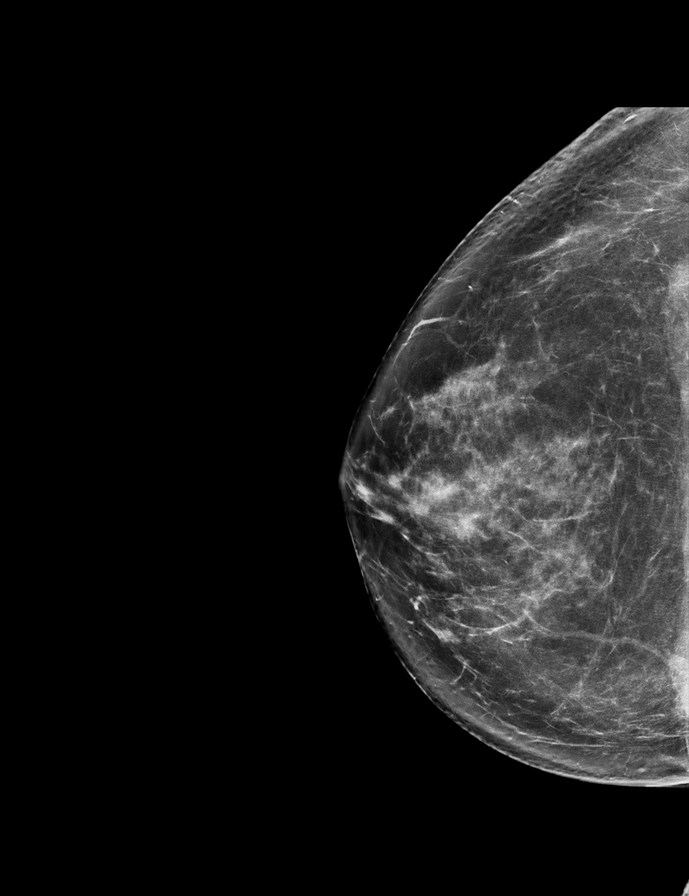

[L CC synth-2D]
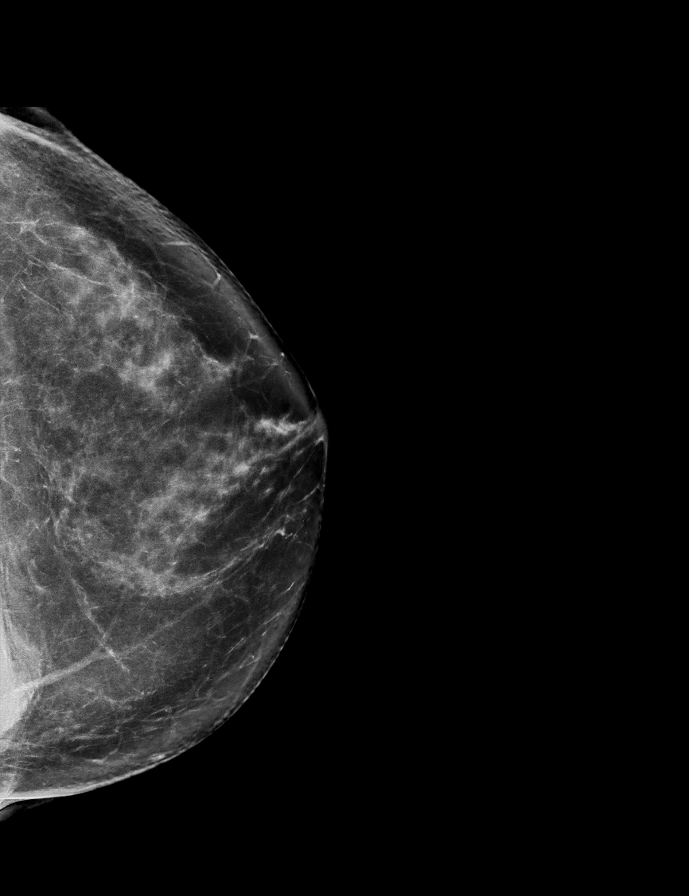

[L MLO tomo · 2 of 78 frames shown]
[frame 26/78]
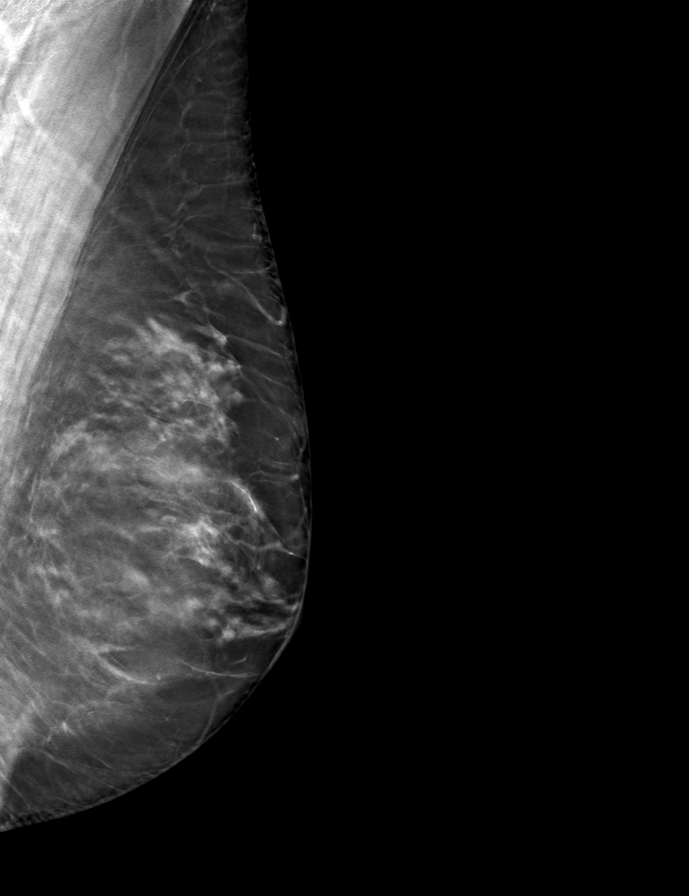
[frame 39/78]
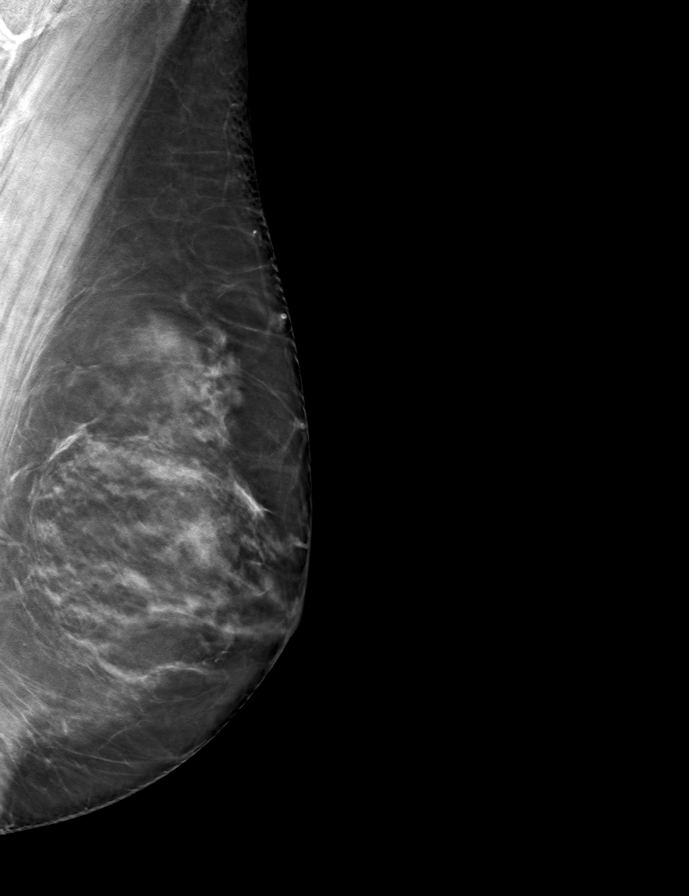

[R MLO tomo · tomo slice 35/70.0]
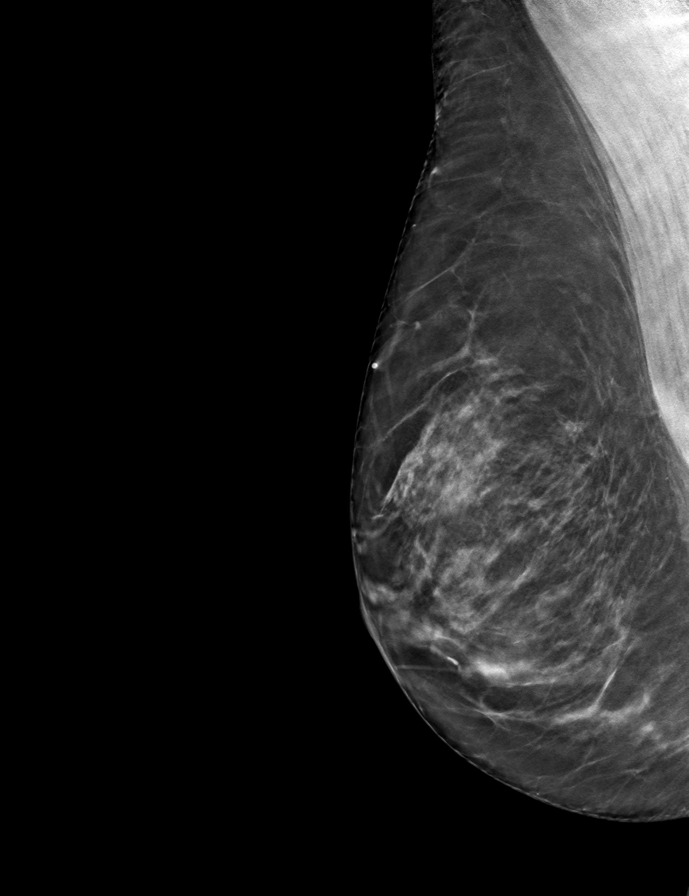

[L CC tomo · tomo slice 40/79.0]
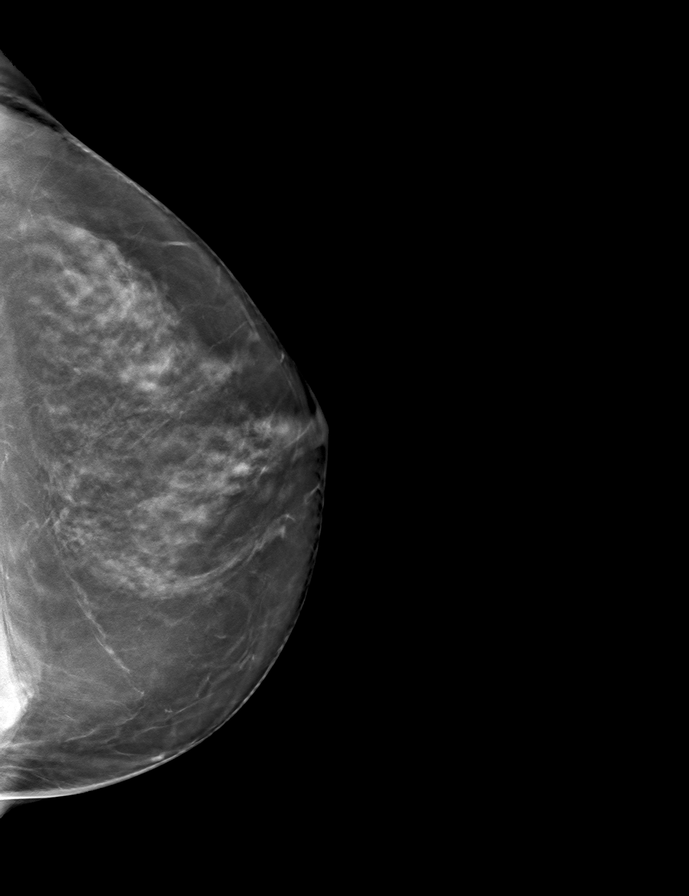

[R CC tomo · tomo slice 39/76.0]
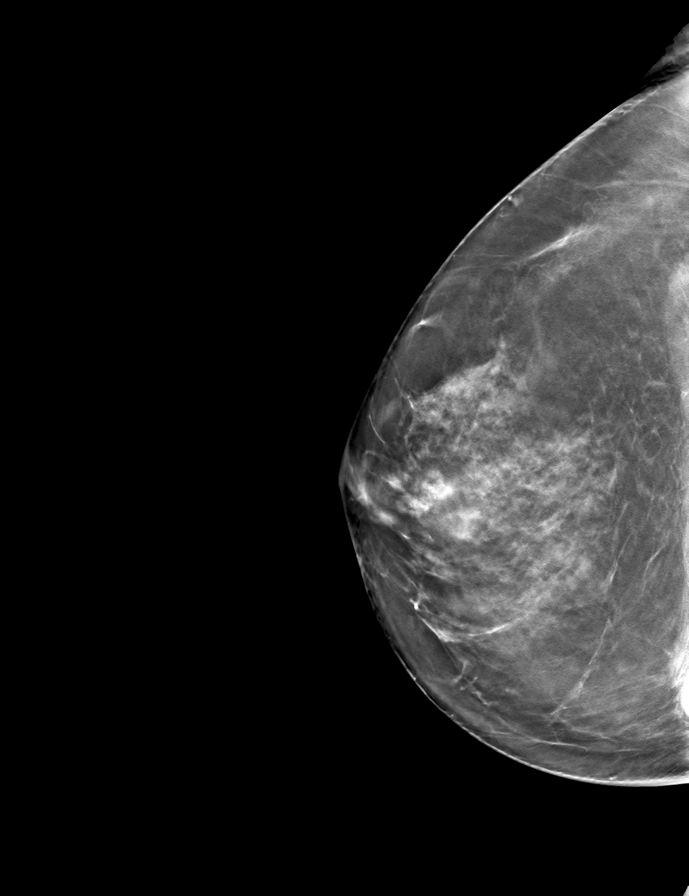

[9 of 24 positions shown; findings below may reference images not displayed]

ACR Breast Density Category c: The breast tissue is heterogeneously
dense, which may obscure small masses.
FINDINGS: There are no findings suspicious for malignancy.
IMPRESSION: No mammographic evidence of malignancy. A result letter of this
screening mammogram will be mailed directly to the patient.

RECOMMENDATION:
Screening mammogram in one year. (Code:Q3-W-BC3)

BI-RADS CATEGORY  1: Negative.

## 2023-12-26 ENCOUNTER — Encounter: Payer: Self-pay | Admitting: Obstetrics and Gynecology

## 2023-12-26 ENCOUNTER — Other Ambulatory Visit: Payer: Self-pay | Admitting: Obstetrics and Gynecology

## 2023-12-26 DIAGNOSIS — N644 Mastodynia: Secondary | ICD-10-CM

## 2024-01-10 ENCOUNTER — Ambulatory Visit
Admission: RE | Admit: 2024-01-10 | Discharge: 2024-01-10 | Disposition: A | Discharge status: Home / Self Care | Source: Ambulatory Visit | Attending: Obstetrics and Gynecology | Admitting: Obstetrics and Gynecology

## 2024-01-10 ENCOUNTER — Ambulatory Visit

## 2024-01-10 DIAGNOSIS — N644 Mastodynia: Secondary | ICD-10-CM
